# Patient Record
Sex: Male | Born: 1985 | Race: White | Hispanic: No | Marital: Single | Smoking: Never smoker
Health system: Southern US, Community
[De-identification: ages and names within clinical notes are randomized; demographics above are authoritative.]

## PROBLEM LIST (undated history)

## (undated) HISTORY — PX: HERNIA REPAIR: SHX51

---

## 2019-10-15 ENCOUNTER — Emergency Department (HOSPITAL_COMMUNITY): Payer: Self-pay

## 2019-10-15 ENCOUNTER — Emergency Department (HOSPITAL_COMMUNITY): Payer: Self-pay | Admitting: Anesthesiology

## 2019-10-15 ENCOUNTER — Encounter (HOSPITAL_COMMUNITY)
Admission: EM | Disposition: A | Discharge status: Home / Self Care | Payer: Self-pay | Source: Home / Self Care | Attending: Emergency Medicine

## 2019-10-15 ENCOUNTER — Ambulatory Visit (HOSPITAL_COMMUNITY)
Admission: EM | Admit: 2019-10-15 | Discharge: 2019-10-15 | Disposition: A | Discharge status: Home / Self Care | Payer: Self-pay | Attending: Emergency Medicine | Admitting: Emergency Medicine

## 2019-10-15 ENCOUNTER — Encounter (HOSPITAL_COMMUNITY): Payer: Self-pay | Admitting: Emergency Medicine

## 2019-10-15 DIAGNOSIS — X58XXXA Exposure to other specified factors, initial encounter: Secondary | ICD-10-CM | POA: Insufficient documentation

## 2019-10-15 DIAGNOSIS — T185XXA Foreign body in anus and rectum, initial encounter: Secondary | ICD-10-CM | POA: Insufficient documentation

## 2019-10-15 DIAGNOSIS — K644 Residual hemorrhoidal skin tags: Secondary | ICD-10-CM | POA: Insufficient documentation

## 2019-10-15 DIAGNOSIS — Z20822 Contact with and (suspected) exposure to covid-19: Secondary | ICD-10-CM | POA: Insufficient documentation

## 2019-10-15 DIAGNOSIS — F17290 Nicotine dependence, other tobacco product, uncomplicated: Secondary | ICD-10-CM | POA: Insufficient documentation

## 2019-10-15 HISTORY — PX: HEMORRHOID SURGERY: SHX153

## 2019-10-15 LAB — RESPIRATORY PANEL BY RT PCR (FLU A&B, COVID)
Influenza A by PCR: NEGATIVE
Influenza B by PCR: NEGATIVE
SARS Coronavirus 2 by RT PCR: NEGATIVE

## 2019-10-15 LAB — CBC WITH DIFFERENTIAL/PLATELET
Abs Immature Granulocytes: 0.03 10*3/uL (ref 0.00–0.07)
Basophils Absolute: 0 10*3/uL (ref 0.0–0.1)
Basophils Relative: 0 %
Eosinophils Absolute: 0.1 10*3/uL (ref 0.0–0.5)
Eosinophils Relative: 1 %
HCT: 48.2 % (ref 39.0–52.0)
Hemoglobin: 16.2 g/dL (ref 13.0–17.0)
Immature Granulocytes: 0 %
Lymphocytes Relative: 31 %
Lymphs Abs: 2.6 10*3/uL (ref 0.7–4.0)
MCH: 28.9 pg (ref 26.0–34.0)
MCHC: 33.6 g/dL (ref 30.0–36.0)
MCV: 86.1 fL (ref 80.0–100.0)
Monocytes Absolute: 0.7 10*3/uL (ref 0.1–1.0)
Monocytes Relative: 8 %
Neutro Abs: 4.8 10*3/uL (ref 1.7–7.7)
Neutrophils Relative %: 60 %
Platelets: 322 10*3/uL (ref 150–400)
RBC: 5.6 MIL/uL (ref 4.22–5.81)
RDW: 13.2 % (ref 11.5–15.5)
WBC: 8.2 10*3/uL (ref 4.0–10.5)
nRBC: 0 % (ref 0.0–0.2)

## 2019-10-15 LAB — BASIC METABOLIC PANEL
Anion gap: 10 (ref 5–15)
BUN: 11 mg/dL (ref 6–20)
CO2: 26 mmol/L (ref 22–32)
Calcium: 9.9 mg/dL (ref 8.9–10.3)
Chloride: 102 mmol/L (ref 98–111)
Creatinine, Ser: 1.04 mg/dL (ref 0.61–1.24)
GFR calc Af Amer: >60 mL/min (ref >60)
GFR calc non Af Amer: >60 mL/min (ref >60)
Glucose, Bld: 100 mg/dL — ABNORMAL HIGH (ref 70–99)
Potassium: 4.5 mmol/L (ref 3.5–5.1)
Sodium: 138 mmol/L (ref 135–145)

## 2019-10-15 LAB — SAMPLE TO BLOOD BANK

## 2019-10-15 SURGERY — HEMORRHOIDECTOMY
Anesthesia: General | Site: Rectum

## 2019-10-15 MED ORDER — LACTATED RINGERS IV SOLN: INTRAVENOUS | Status: DC | PRN

## 2019-10-15 MED ORDER — FENTANYL CITRATE (PF) 250 MCG/5ML IJ SOLN
INTRAMUSCULAR | Status: DC | PRN
  Administered 2019-10-15: 100 ug via INTRAVENOUS

## 2019-10-15 MED ORDER — FENTANYL CITRATE (PF) 100 MCG/2ML IJ SOLN
INTRAMUSCULAR | Status: AC
  Filled 2019-10-15: qty 2

## 2019-10-15 MED ORDER — FENTANYL CITRATE (PF) 100 MCG/2ML IJ SOLN
25.0000 ug | INTRAMUSCULAR | Status: DC | PRN
  Administered 2019-10-15: 23:00:00 50 ug via INTRAVENOUS

## 2019-10-15 MED ORDER — PROMETHAZINE HCL 25 MG/ML IJ SOLN: 6.2500 mg | INTRAMUSCULAR | Status: DC | PRN

## 2019-10-15 MED ORDER — FENTANYL CITRATE (PF) 250 MCG/5ML IJ SOLN
INTRAMUSCULAR | Status: AC
  Filled 2019-10-15: qty 5

## 2019-10-15 MED ORDER — 0.9 % SODIUM CHLORIDE (POUR BTL) OPTIME
TOPICAL | Status: DC | PRN
  Administered 2019-10-15: 22:00:00 1000 mL

## 2019-10-15 MED ORDER — DEXAMETHASONE SODIUM PHOSPHATE 10 MG/ML IJ SOLN
INTRAMUSCULAR | Status: DC | PRN
  Administered 2019-10-15: 10 mg via INTRAVENOUS

## 2019-10-15 MED ORDER — MIDAZOLAM HCL 2 MG/2ML IJ SOLN
INTRAMUSCULAR | Status: DC | PRN
  Administered 2019-10-15: 2 mg via INTRAVENOUS

## 2019-10-15 MED ORDER — MIDAZOLAM HCL 2 MG/2ML IJ SOLN
INTRAMUSCULAR | Status: AC
  Filled 2019-10-15: qty 2

## 2019-10-15 MED ORDER — MORPHINE SULFATE (PF) 4 MG/ML IV SOLN
4.0000 mg | Freq: Once | INTRAVENOUS | Status: AC
  Administered 2019-10-15: 21:00:00 4 mg via INTRAVENOUS
  Filled 2019-10-15: qty 1

## 2019-10-15 MED ORDER — SODIUM CHLORIDE 0.9 % IV BOLUS
1000.0000 mL | Freq: Once | INTRAVENOUS | Status: AC
  Administered 2019-10-15: 21:00:00 1000 mL via INTRAVENOUS

## 2019-10-15 MED ORDER — ONDANSETRON HCL 4 MG/2ML IJ SOLN
INTRAMUSCULAR | Status: DC | PRN
  Administered 2019-10-15: 4 mg via INTRAVENOUS

## 2019-10-15 MED ORDER — LIDOCAINE HCL (CARDIAC) PF 100 MG/5ML IV SOSY
PREFILLED_SYRINGE | INTRAVENOUS | Status: DC | PRN
  Administered 2019-10-15: 100 mg via INTRATRACHEAL

## 2019-10-15 MED ORDER — PROPOFOL 10 MG/ML IV BOLUS
INTRAVENOUS | Status: DC | PRN
  Administered 2019-10-15: 200 mg via INTRAVENOUS

## 2019-10-15 SURGICAL SUPPLY — 34 items
CANISTER SUCT 3000ML PPV (MISCELLANEOUS) ×3 IMPLANT
COVER SURGICAL LIGHT HANDLE (MISCELLANEOUS) ×3 IMPLANT
COVER WAND RF STERILE (DRAPES) ×3 IMPLANT
DECANTER SPIKE VIAL GLASS SM (MISCELLANEOUS) ×3 IMPLANT
DRAPE HALF SHEET 40X57 (DRAPES) IMPLANT
DRAPE LAPAROTOMY T 102X78X121 (DRAPES) IMPLANT
DRAPE UTILITY XL STRL (DRAPES) ×6 IMPLANT
DRSG PAD ABDOMINAL 8X10 ST (GAUZE/BANDAGES/DRESSINGS) ×3 IMPLANT
ELECT REM PT RETURN 9FT ADLT (ELECTROSURGICAL) ×3
ELECTRODE REM PT RTRN 9FT ADLT (ELECTROSURGICAL) ×1 IMPLANT
GAUZE SPONGE 4X4 12PLY STRL (GAUZE/BANDAGES/DRESSINGS) ×3 IMPLANT
GLOVE BIOGEL M STRL SZ7.5 (GLOVE) ×3 IMPLANT
GLOVE BIOGEL PI IND STRL 8 (GLOVE) ×2 IMPLANT
GLOVE BIOGEL PI INDICATOR 8 (GLOVE) ×4
GOWN STRL REUS W/ TWL LRG LVL3 (GOWN DISPOSABLE) ×1 IMPLANT
GOWN STRL REUS W/TWL 2XL LVL3 (GOWN DISPOSABLE) ×3 IMPLANT
GOWN STRL REUS W/TWL LRG LVL3 (GOWN DISPOSABLE) ×2
KIT BASIN OR (CUSTOM PROCEDURE TRAY) ×3 IMPLANT
KIT SIGMOIDOSCOPE (SET/KITS/TRAYS/PACK) IMPLANT
KIT TURNOVER KIT B (KITS) ×3 IMPLANT
NEEDLE HYPO 25GX1X1/2 BEV (NEEDLE) ×3 IMPLANT
NS IRRIG 1000ML POUR BTL (IV SOLUTION) ×3 IMPLANT
PACK GENERAL/GYN (CUSTOM PROCEDURE TRAY) IMPLANT
PACK LITHOTOMY IV (CUSTOM PROCEDURE TRAY) IMPLANT
PAD ARMBOARD 7.5X6 YLW CONV (MISCELLANEOUS) ×6 IMPLANT
PENCIL SMOKE EVACUATOR (MISCELLANEOUS) ×3 IMPLANT
SHEARS HARMONIC 9CM CVD (BLADE) IMPLANT
SPONGE SURGIFOAM ABS GEL 100 (HEMOSTASIS) IMPLANT
SURGILUBE 2OZ TUBE FLIPTOP (MISCELLANEOUS) ×3 IMPLANT
SUT CHROMIC 3 0 SH 27 (SUTURE) IMPLANT
SYR CONTROL 10ML LL (SYRINGE) ×3 IMPLANT
TOWEL GREEN STERILE (TOWEL DISPOSABLE) ×3 IMPLANT
TOWEL GREEN STERILE FF (TOWEL DISPOSABLE) ×3 IMPLANT
UNDERPAD 30X30 (UNDERPADS AND DIAPERS) ×3 IMPLANT

## 2019-10-15 NOTE — Anesthesia Postprocedure Evaluation (Signed)
Anesthesia Post Note  Patient: Xavier Mitchell  Procedure(s) Performed: Exam under anesthesia, Removal of Rectal Foreign Body (N/A Rectum)     Patient location during evaluation: PACU Anesthesia Type: General Level of consciousness: sedated Pain management: pain level controlled Vital Signs Assessment: post-procedure vital signs reviewed and stable Respiratory status: spontaneous breathing and respiratory function stable Cardiovascular status: stable Postop Assessment: no apparent nausea or vomiting Anesthetic complications: no    Last Vitals:  Vitals:   10/15/19 2330 10/15/19 2343  BP: (!) 131/99 (!) 141/98  Pulse: 85 90  Resp: 15 (!) 22  Temp:  36.7 C  SpO2: 100% 100%    Last Pain:  Vitals:   10/15/19 2330  TempSrc:   PainSc: 3                  Shayann Garbutt DANIEL

## 2019-10-15 NOTE — Anesthesia Procedure Notes (Signed)
Procedure Name: LMA Insertion Date/Time: 10/15/2019 10:39 PM Performed by: Molli Hazard, CRNA Pre-anesthesia Checklist: Patient identified, Emergency Drugs available, Suction available and Patient being monitored Patient Re-evaluated:Patient Re-evaluated prior to induction Oxygen Delivery Method: Circle system utilized Preoxygenation: Pre-oxygenation with 100% oxygen Induction Type: IV induction Ventilation: Mask ventilation without difficulty LMA: LMA inserted LMA Size: 4.0 Number of attempts: 1 Placement Confirmation: positive ETCO2 Tube secured with: Tape Dental Injury: Teeth and Oropharynx as per pre-operative assessment

## 2019-10-15 NOTE — Op Note (Signed)
10/15/2019  10:59 PM  PATIENT:  Xavier Mitchell  34 y.o. male  PRE-OPERATIVE DIAGNOSIS:  Rectal Foreign body  POST-OPERATIVE DIAGNOSIS:  Rectal Foreign body (dildo)  PROCEDURE:  Procedure(s): Exam under anesthesia, anoscopy,  Removal of Rectal Foreign Body  SURGEON:  Surgeon(s): Gaynelle Adu, MD  ASSISTANTS: none   ANESTHESIA:   general  DRAINS: none   LOCAL MEDICATIONS USED:  NONE  SPECIMEN:  Source of Specimen:  rectal foreign body/dildo  DISPOSITION OF SPECIMEN:  given back to patient  COUNTS:  YES  INDICATION FOR PROCEDURE: Patient is a 34 year old male who had a sex toy become lodged in his rectum earlier this morning.  He was not able to extract it.  He presented to the emergency room for evaluation.  It was not radiopaque so ED staff ordered a CT abdomen pelvis which demonstrated a rectal foreign body measuring up to 19 cm long by 4 cm wide extending up to the level of the umbilicus.  It was able to be felt on digital rectal exam but could not be grasped and extracted in the emergency room.  Recommended exam under anesthesia with removal.  Please see separately dictated note regarding discussion about risk and benefits  PROCEDURE: Patient's rapid Covid test was negative.  He was brought to the OR 2 at [facility] placed supine on the operating room table.  General LMA anesthesia was established.  He was placed in lithotomy position with the appropriate padding.  He has perineum and buttocks were prepped and draped with Betadine in the usual standard surgical fashion.  Surgical timeout was performed.  Visual inspection of his perineum and anus revealed some redundant nonthrombosed external hemorrhoidal tissue.  Digital rectal exam was performed and I could palpate the foreign body.  It was probably about 9cm from the anal verge. I then serially dilated the anus with my fingers.  Then a Hill-Ferguson retractor was placed and I was able to grasp the edge of the foreign body with  a hemorrhoidal clamp and remove it from the rectum.  It was removed in its entirety.  Anoscopy revealed no trauma to the anal rectal area other than a very small superficial tear in some of the external hemorrhoidal tissue which was not an unexpected occurrence. it had a little bit of minor oozing.  Patient was placed back in supine position extubated and taken to the recovery room in stable condition.  All counts were correct x2.  No immediate complications.  PLAN OF CARE: Discharge to home after PACU  PATIENT DISPOSITION:  PACU - hemodynamically stable.   Delay start of Pharmacological VTE agent (>24hrs) due to surgical blood loss or risk of bleeding:  not applicable  Mary Sella. Andrey Campanile, MD, FACS General, Bariatric, & Minimally Invasive Surgery [facility], [place]

## 2019-10-15 NOTE — Discharge Instructions (Signed)
[facility] _______[facility]  RECTAL SURGERY POST OP INSTRUCTIONS: POST OP INSTRUCTIONS  Always review your discharge instruction sheet given to you by the facility where your surgery was performed. IF YOU HAVE DISABILITY OR FAMILY LEAVE FORMS, YOU MUST BRING THEM TO THE OFFICE FOR PROCESSING.   DO NOT GIVE THEM TO YOUR DOCTOR.  1. A  prescription for pain medication may be given to you upon discharge.  Take your pain medication as prescribed, if needed.  If narcotic pain medicine is not needed, then you may take acetaminophen (Tylenol) or ibuprofen (Advil) as needed. 2. Take your usually prescribed medications unless otherwise directed. 3. If you need a refill on your pain medication, please contact your pharmacy.  They will contact our office to request authorization. Prescriptions will not be filled after 5 pm or on week-ends. 4. You should follow a light diet the first 48 hours after arrival home, such as soup and crackers, etc.  Be sure to include lots of fluids daily.  Resume your normal diet 2-3 days after surgery.. 5. Most patients will experience some swelling and discomfort in the rectal area. Ice packs, reclining and warm tub soaks will help.  Swelling and discomfort can take several days to resolve.  6. It is common to experience some constipation if taking pain medication after surgery.  Increasing fluid intake and taking a stool softener (such as Colace) will usually help or prevent this problem from occurring.  A mild laxative (Milk of Magnesia or Miralax) should be taken according to package directions if there are no bowel movements after 48 hours. 7. Unless discharge instructions indicate otherwise, leave your bandage dry and in place for 24 hours, or remove the bandage if you have a bowel movement. You may notice a small amount of bleeding with bowel movements for the first few days. You may have some packing in the rectum which will come out over the first day or two. You  will need to wear an absorbent pad or soft cotton gauze in your underwear until the drainage stops.it. 8. ACTIVITIES:  You may resume regular (light) daily activities beginning the next day--such as daily self-care, walking, climbing stairs--gradually increasing activities as tolerated.  You may have sexual intercourse when it is comfortable.  Refrain from any heavy lifting or straining until approved by your doctor. a. You may drive when you are no longer taking prescription pain medication, you can comfortably wear a seatbelt, and you can safely maneuver your car and apply brakes. b. RETURN TO WORK: : ____________________ c.  9. You should see your doctor in the office for a follow-up appointment approximately 2-3 weeks after your surgery.  Make sure that you call for this appointment within a day or two after you arrive home to insure a convenient appointment time. 10. OTHER INSTRUCTIONS:  __________________________________________________________________________________________________________________________________________________________________________________________  WHEN TO CALL YOUR DOCTOR: 1. Fever over 101.0 2. Inability to urinate 3. Nausea and/or vomiting 4. Extreme swelling or bruising 5. Continued bleeding from rectum. 6. Increased pain, redness, or drainage from the incision 7. Constipation  The clinic staff is available to answer your questions during regular business hours.  Please don't hesitate to call and ask to speak to one of the nurses for clinical concerns.  If you have a medical emergency, go to the nearest emergency room or call 911.  A surgeon from [facility] is always on call at the hospital   [address], [place], [place], [place]  [address] ?  [id number], [place], [place]   [address] [phone] ? [phone] ? FAX [phone] Web site: [email or web address]  ........Marland Kitchen   Managing Your Pain After Surgery Without  Opioids    Thank you for participating in our program to help patients manage their pain after surgery without opioids. This is part of our effort to provide you with the best care possible, without exposing you or your family to the risk that opioids pose.  What pain can I expect after surgery? You can expect to have some pain after surgery. This is normal. The pain is typically worse the day after surgery, and quickly begins to get better. Many studies have found that many patients are able to manage their pain after surgery with Over-the-Counter (OTC) medications such as Tylenol and Motrin. If you have a condition that does not allow you to take Tylenol or Motrin, notify your surgical team.  How will I manage my pain? The best strategy for controlling your pain after surgery is around the clock pain control with Tylenol (acetaminophen) and Motrin (ibuprofen or Advil). Alternating these medications with each other allows you to maximize your pain control. In addition to Tylenol and Motrin, you can use heating pads or ice packs on your incisions to help reduce your pain.  How will I alternate your regular strength over-the-counter pain medication? You will take a dose of pain medication every three hours. ; Start by taking 650 mg of Tylenol (2 pills of 325 mg) ; 3 hours later take 600 mg of Motrin (3 pills of 200 mg) ; 3 hours after taking the Motrin take 650 mg of Tylenol ; 3 hours after that take 600 mg of Motrin.   - 1 -  See example - if your first dose of Tylenol is at 12:00 PM   12:00 PM Tylenol 650 mg (2 pills of 325 mg)  3:00 PM Motrin 600 mg (3 pills of 200 mg)  6:00 PM Tylenol 650 mg (2 pills of 325 mg)  9:00 PM Motrin 600 mg (3 pills of 200 mg)  Continue alternating every 3 hours   We recommend that you follow this schedule around-the-clock for at least 3 days after surgery, or until you feel that it is no longer needed. Use the table on the last page of this handout to  keep track of the medications you are taking. Important: Do not take more than 3000mg  of Tylenol or 1800mg  of Motrin in a 24-hour period. Do not take ibuprofen/Motrin if you have a history of bleeding stomach ulcers, severe kidney disease, &/or actively taking a blood thinner  What if I still have pain? If you have pain that is not controlled with the over-the-counter pain medications (Tylenol and Motrin or Advil) you might have what we call "breakthrough" pain. You will receive a prescription for a small amount of an opioid pain medication such as Oxycodone, Tramadol, or Tylenol with Codeine. Use these opioid pills in the first 24 hours after surgery if you have breakthrough pain. Do not take more than 1 pill every 4-6 hours.  If you still have uncontrolled pain after using all opioid pills, don't hesitate to call our staff using the number provided. We will help make sure you are managing your pain in the best way possible, and if necessary, we can provide a prescription for additional pain medication.   Day 1    Time  Name of Medication Number of pills taken  Amount of Acetaminophen  Pain Level  Comments  AM PM       AM PM       AM PM       AM PM       AM PM       AM PM       AM PM       AM PM       Total Daily amount of Acetaminophen Do not take more than  3,000 mg per day      Day 2    Time  Name of Medication Number of pills taken  Amount of Acetaminophen  Pain Level   Comments  AM PM       AM PM       AM PM       AM PM       AM PM       AM PM       AM PM       AM PM       Total Daily amount of Acetaminophen Do not take more than  3,000 mg per day      Day 3    Time  Name of Medication Number of pills taken  Amount of Acetaminophen  Pain Level   Comments  AM PM       AM PM       AM PM       AM PM          AM PM       AM PM       AM PM       AM PM       Total Daily amount of Acetaminophen Do not take more than  3,000 mg per day      Day  4    Time  Name of Medication Number of pills taken  Amount of Acetaminophen  Pain Level   Comments  AM PM       AM PM       AM PM       AM PM       AM PM       AM PM       AM PM       AM PM       Total Daily amount of Acetaminophen Do not take more than  3,000 mg per day      Day 5    Time  Name of Medication Number of pills taken  Amount of Acetaminophen  Pain Level   Comments  AM PM       AM PM       AM PM       AM PM       AM PM       AM PM       AM PM       AM PM       Total Daily amount of Acetaminophen Do not take more than  3,000 mg per day       Day 6    Time  Name of Medication Number of pills taken  Amount of Acetaminophen  Pain Level  Comments  AM PM       AM PM       AM PM       AM PM       AM PM       AM PM       AM  PM       AM PM       Total Daily amount of Acetaminophen Do not take more than  3,000 mg per day      Day 7    Time  Name of Medication Number of pills taken  Amount of Acetaminophen  Pain Level   Comments  AM PM       AM PM       AM PM       AM PM       AM PM       AM PM       AM PM       AM PM       Total Daily amount of Acetaminophen Do not take more than  3,000 mg per day        For additional information about how and where to safely dispose of unused opioid medications - [employer]  Disclaimer: This document contains information and/or instructional materials adapted from [place] for the typical patient with your condition. It does not replace medical advice from your health care provider because your experience may differ from that of the typical patient. Talk to your health care provider if you have any questions about this document, your condition or your treatment plan. Adapted from [place]

## 2019-10-15 NOTE — ED Provider Notes (Signed)
[facility] EMERGENCY DEPARTMENT Provider Note   CSN: [id number] Arrival date & time: 10/15/19  1357     History Chief Complaint  Patient presents with  . Foreign Body in Rectum    Xavier Mitchell is a 34 y.o. male with no significant past medical history who presents today for evaluation of a foreign body.  He reports that approximately 2 hours prior to arrival he inserted a approximately 6 inch long rubber sex toys into his rectum accidentally too far.  He reports 5 out of 10 pain in his lower abdomen.  He states that prior to coming he tried multiple times to remove it on his own by methods including digital extraction and attempts at defecating it out.  He states that it was angled backwards towards his tailbone and he was unable to extract it.   HPI     History reviewed. No pertinent past medical history.  There are no problems to display for this patient.   Past Surgical History:  Procedure Laterality Date  . HERNIA REPAIR     As an infant       History reviewed. No pertinent family history.  Social History   Tobacco Use  . Smoking status: Never Smoker  . Smokeless tobacco: Never Used  Substance Use Topics  . Alcohol use: Not on file  . Drug use: Not on file    Home Medications Prior to Admission medications   Not on File    Allergies    Patient has no known allergies.  Review of Systems   Review of Systems  Constitutional: Negative for chills and fever.  HENT: Negative for congestion.   Respiratory: Negative for cough and shortness of breath.   Gastrointestinal: Positive for abdominal pain and rectal pain. Negative for nausea and vomiting.  Musculoskeletal: Negative for back pain and neck pain.  Neurological: Negative for weakness and headaches.  Psychiatric/Behavioral: Negative for confusion.    Physical Exam Updated Vital Signs BP (!) 138/91   Pulse 81   Temp 97.9 F (36.6 C) (Oral)   Resp 18   SpO2 99%   Physical  Exam Vitals and nursing note reviewed. Exam conducted with a chaperone present Jeraldine Loots).  Constitutional:      General: He is not in acute distress.    Appearance: He is well-developed. He is not diaphoretic.  HENT:     Head: Normocephalic and atraumatic.  Eyes:     General: No scleral icterus.       Right eye: No discharge.        Left eye: No discharge.     Conjunctiva/sclera: Conjunctivae normal.  Cardiovascular:     Rate and Rhythm: Normal rate and regular rhythm.  Pulmonary:     Effort: Pulmonary effort is normal. No respiratory distress.     Breath sounds: No stridor.  Abdominal:     General: There is no distension.  Genitourinary:    Comments: DRE with palpable foreign body in the rectum with out a flange or base palpable.   Musculoskeletal:        General: No deformity.     Cervical back: Normal range of motion.  Skin:    General: Skin is warm and dry.  Neurological:     General: No focal deficit present.     Mental Status: He is alert.     Cranial Nerves: No cranial nerve deficit.     Motor: No abnormal muscle tone.  Psychiatric:  Mood and Affect: Mood normal.        Behavior: Behavior normal.     ED Results / Procedures / Treatments   Labs (all labs ordered are listed, but only abnormal results are displayed) Labs Reviewed  BASIC METABOLIC PANEL - Abnormal; Notable for the following components:      Result Value   Glucose, Bld 100 (*)    All other components within normal limits  RESPIRATORY PANEL BY RT PCR (FLU A&B, COVID)  CBC WITH DIFFERENTIAL/PLATELET  SAMPLE TO BLOOD BANK    EKG None  Radiology CT Abdomen Pelvis Wo Contrast  Result Date: 10/15/2019 CLINICAL DATA:  Foreign body in rectum EXAM: CT ABDOMEN AND PELVIS WITHOUT CONTRAST TECHNIQUE: Multidetector CT imaging of the abdomen and pelvis was performed following the standard protocol without IV contrast. COMPARISON:  10/15/2019 radiograph FINDINGS: Lower chest: Lung bases are clear.   Heart size is normal Hepatobiliary: No focal liver abnormality is seen. No gallstones, gallbladder wall thickening, or biliary dilatation. Pancreas: Unremarkable. No pancreatic ductal dilatation or surrounding inflammatory changes. Spleen: Normal in size without focal abnormality. Adrenals/Urinary Tract: Adrenal glands are normal. No hydronephrosis. Punctate stone lower pole left kidney. The bladder is unremarkable Stomach/Bowel: The stomach is normal. No dilated small bowel. Negative appendix. Large foreign body within the rectum and sigmoid colon, dome-shaped at the apex and measures about 19.3 cm maximum length and about 4 cm in diameter. No extraluminal gas to suggest a perforation. Vascular/Lymphatic: No significant vascular findings are present. No enlarged abdominal or pelvic lymph nodes. Reproductive: Prostate is unremarkable. Other: Negative for free air or free fluid Musculoskeletal: No acute or significant osseous findings. IMPRESSION: 1. 19.3 cm long by 4 cm wide foreign body within the rectum and sigmoid colon. No extraluminal gas to suggest perforation. 2. Nonobstructing stone in the left kidney Electronically Signed   By: Donavan Foil M.D.   On: 10/15/2019 20:45   DG Abd 2 Views  Result Date: 10/15/2019 CLINICAL DATA:  History of rectal foreign body. EXAM: ABDOMEN - 2 VIEW COMPARISON:  No prior. FINDINGS: No bowel distention. Stool noted throughout the colon. No radiopaque foreign body noted. Pelvic calcifications consistent with phleboliths. No acute bony abnormality identified. Lung bases are clear. IMPRESSION: No radiopaque foreign body.  No bowel distention or free air. Electronically Signed   By: Marcello Moores  Register   On: 10/15/2019 14:51    Procedures Procedures (including critical care time)  Medications Ordered in ED Medications  sodium chloride 0.9 % bolus 1,000 mL (1,000 mLs Intravenous New Bag/Given 10/15/19 2101)  morphine 4 MG/ML injection 4 mg (4 mg Intravenous Given 10/15/19 2101)     ED Course  I have reviewed the triage vital signs and the nursing notes.  Pertinent labs & imaging results that were available during my care of the patient were reviewed by me and considered in my medical decision making (see chart for details).    MDM Rules/Calculators/A&P                     Xavier Mitchell presents today for evaluation for a rectal foreign body.  He reports that about 2 hours PTA he was using a sex toy and he accidentally insterted it too far into his rectum.  On exam palpable foreign object in rectum.  X-rays obtained with out visualized foreign body.  CT scan was obtained showing a 19.3cm long by 4cm wide foreign body in the rectum and sigmoid colon.  I spoke with Dr.  Wilson from surgery who will see patient for removal in the OR.  Patients pain was treated with morphine, rapid covid test was sent.  Patient kept NPO.    Patient was seen as a shared visit with Dr. Jeraldine Loots.    Provided patient education about the importance of a wide base.   Patient remained hemodynamically stable while in my care.    Final Clinical Impression(s) / ED Diagnoses Final diagnoses:  Foreign body anus/rectum    Rx / DC Orders ED Discharge Orders    None       Norman Clay 10/15/19 2221    Gerhard Munch, MD 10/15/19 2320

## 2019-10-15 NOTE — ED Triage Notes (Signed)
Pt reports 3 hours ago getting a 6 inch sex toy stuck in rectum.

## 2019-10-15 NOTE — Anesthesia Preprocedure Evaluation (Addendum)
Anesthesia Evaluation  Patient identified by MRN, date of birth, ID band Patient awake    Reviewed: Allergy & Precautions, NPO status , Patient's Chart, lab work & pertinent test results  History of Anesthesia Complications Negative for: history of anesthetic complications  Airway Mallampati: II  TM Distance: >3 FB Neck ROM: Full    Dental no notable dental hx. (+) Dental Advisory Given   Pulmonary neg pulmonary ROS,    Pulmonary exam normal        Cardiovascular negative cardio ROS Normal cardiovascular exam     Neuro/Psych negative neurological ROS     GI/Hepatic Neg liver ROS,   Endo/Other  negative endocrine ROS  Renal/GU negative Renal ROS     Musculoskeletal negative musculoskeletal ROS (+)   Abdominal   Peds  Hematology negative hematology ROS (+)   Anesthesia Other Findings Day of surgery medications reviewed with the patient.  Reproductive/Obstetrics                            Anesthesia Physical Anesthesia Plan  ASA: II and emergent  Anesthesia Plan: General   Post-op Pain Management:    Induction: Intravenous  PONV Risk Score and Plan: 2 and Ondansetron and Dexamethasone  Airway Management Planned: LMA  Additional Equipment:   Intra-op Plan:   Post-operative Plan: Extubation in OR  Informed Consent: I have reviewed the patients History and Physical, chart, labs and discussed the procedure including the risks, benefits and alternatives for the proposed anesthesia with the patient or authorized representative who has indicated his/her understanding and acceptance.     Dental advisory given  Plan Discussed with: Anesthesiologist and CRNA  Anesthesia Plan Comments:        Anesthesia Quick Evaluation

## 2019-10-15 NOTE — Transfer of Care (Signed)
Immediate Anesthesia Transfer of Care Note  Patient: Xavier Mitchell  Procedure(s) Performed: Exam under anesthesia, Removal of Rectal Foreign Body (N/A Rectum)  Patient Location: PACU  Anesthesia Type:General  Level of Consciousness: awake, alert  and oriented  Airway & Oxygen Therapy: Patient Spontanous Breathing  Post-op Assessment: Report given to RN and Post -op Vital signs reviewed and stable  Post vital signs: Reviewed and stable  Last Vitals:  Vitals Value Taken Time  BP    Temp    Pulse 101 10/15/19 2313  Resp    SpO2 100 % 10/15/19 2313  Vitals shown include unvalidated device data.  Last Pain:  Vitals:   10/15/19 2103  TempSrc:   PainSc: 6          Complications: No apparent anesthesia complications

## 2019-10-15 NOTE — H&P (Signed)
Xavier Mitchell is an 34 y.o. male.   Chief Complaint: rectal pain, stuck sex toy HPI: 34 year old otherwise healthy male reports that he got a sex toy stuck in his rectum around 7 AM this morning.  He could not manually remove it himself.  He tried having a bowel movement but did not result in passing the sex toy.  He ended up coming to the emergency room around 2 PM since it had not passed.  He denies any fever, chills, nausea or vomiting.  He reports some generalized rectal and abdominal discomfort.  He denies any prior abdominal surgery.  He denies any chest pain, chest pressure, shortness of breath, fever, loss of taste or smell    History reviewed. No pertinent past medical history.  Denies PMHs Denies PSHx No family history on file. Social History:  Electronic cigarettes, infrequent etoh; occasional THC - last use a few days ago. .  Allergies: No Known Allergies  (Not in a hospital admission)   Results for orders placed or performed during the hospital encounter of 10/15/19 (from the past 48 hour(s))  CBC with Differential     Status: None   Collection Time: 10/15/19  6:59 PM  Result Value Ref Range   WBC 8.2 4.0 - 10.5 K/uL   RBC 5.60 4.22 - 5.81 MIL/uL   Hemoglobin 16.2 13.0 - 17.0 g/dL   HCT 12.4 58.0 - 99.8 %   MCV 86.1 80.0 - 100.0 fL   MCH 28.9 26.0 - 34.0 pg   MCHC 33.6 30.0 - 36.0 g/dL   RDW 33.8 25.0 - 53.9 %   Platelets 322 150 - 400 K/uL   nRBC 0.0 0.0 - 0.2 %   Neutrophils Relative % 60 %   Neutro Abs 4.8 1.7 - 7.7 K/uL   Lymphocytes Relative 31 %   Lymphs Abs 2.6 0.7 - 4.0 K/uL   Monocytes Relative 8 %   Monocytes Absolute 0.7 0.1 - 1.0 K/uL   Eosinophils Relative 1 %   Eosinophils Absolute 0.1 0.0 - 0.5 K/uL   Basophils Relative 0 %   Basophils Absolute 0.0 0.0 - 0.1 K/uL   Immature Granulocytes 0 %   Abs Immature Granulocytes 0.03 0.00 - 0.07 K/uL    Comment: Performed at [facility], [address], [place], [place] [address]  Basic metabolic  panel     Status: Abnormal   Collection Time: 10/15/19  6:59 PM  Result Value Ref Range   Sodium 138 135 - 145 mmol/L   Potassium 4.5 3.5 - 5.1 mmol/L   Chloride 102 98 - 111 mmol/L   CO2 26 22 - 32 mmol/L   Glucose, Bld 100 (H) 70 - 99 mg/dL   BUN 11 6 - 20 mg/dL   Creatinine, Ser 1.93 0.61 - 1.24 mg/dL   Calcium 9.9 8.9 - 79.0 mg/dL   GFR calc non Af Amer >60 >60 mL/min   GFR calc Af Amer >60 >60 mL/min   Anion gap 10 5 - 15    Comment: Performed at [facility], [address], [place], [place] [address]  Respiratory Panel by RT PCR (Flu A&B, Covid) - Nasopharyngeal Swab     Status: None   Collection Time: 10/15/19  8:45 PM   Specimen: Nasopharyngeal Swab  Result Value Ref Range   SARS Coronavirus 2 by RT PCR NEGATIVE NEGATIVE    Comment: (NOTE) SARS-CoV-2 target nucleic acids are NOT DETECTED. The SARS-CoV-2 RNA is generally detectable in upper  respiratoy specimens during the acute phase of infection. The lowest concentration of SARS-CoV-2 viral copies this assay can detect is 131 copies/mL. A negative result does not preclude SARS-Cov-2 infection and should not be used as the sole basis for treatment or other patient management decisions. A negative result may occur with  improper specimen collection/handling, submission of specimen other than nasopharyngeal swab, presence of viral mutation(s) within the areas targeted by this assay, and inadequate number of viral copies (<131 copies/mL). A negative result must be combined with clinical observations, patient history, and epidemiological information. The expected result is Negative. Fact Sheet for Patients:  https://www.moore.com/ Fact Sheet for Healthcare Providers:  https://www.young.biz/ This test is not yet ap proved or cleared by the [place] FDA and  has been authorized for detection and/or diagnosis of SARS-CoV-2 by FDA under an Emergency Use Authorization (EUA). This  EUA will remain  in effect (meaning this test can be used) for the duration of the COVID-19 declaration under Section 564(b)(1) of the Act, 21 U.S.C. section 360bbb-3(b)(1), unless the authorization is terminated or revoked sooner.    Influenza A by PCR NEGATIVE NEGATIVE   Influenza B by PCR NEGATIVE NEGATIVE    Comment: (NOTE) The Xpert Xpress SARS-CoV-2/FLU/RSV assay is intended as an aid in  the diagnosis of influenza from Nasopharyngeal swab specimens and  should not be used as a sole basis for treatment. Nasal washings and  aspirates are unacceptable for Xpert Xpress SARS-CoV-2/FLU/RSV  testing. Fact Sheet for Patients: https://www.moore.com/ Fact Sheet for Healthcare Providers: https://www.young.biz/ This test is not yet approved or cleared by the [place] FDA and  has been authorized for detection and/or diagnosis of SARS-CoV-2 by  FDA under an Emergency Use Authorization (EUA). This EUA will remain  in effect (meaning this test can be used) for the duration of the  Covid-19 declaration under Section 564(b)(1) of the Act, 21  U.S.C. section 360bbb-3(b)(1), unless the authorization is  terminated or revoked. Performed at [facility], [address] [place], [place] [address]   Sample to Blood Bank     Status: None   Collection Time: 10/15/19  8:55 PM  Result Value Ref Range   Blood Bank Specimen SAMPLE AVAILABLE FOR TESTING    Sample Expiration      10/16/2019,2359 Performed at [facility], [address], [place], [place] [address]    CT Abdomen Pelvis Wo Contrast  Result Date: 10/15/2019 CLINICAL DATA:  Foreign body in rectum EXAM: CT ABDOMEN AND PELVIS WITHOUT CONTRAST TECHNIQUE: Multidetector CT imaging of the abdomen and pelvis was performed following the standard protocol without IV contrast. COMPARISON:  10/15/2019 radiograph FINDINGS: Lower chest: Lung bases are clear.  Heart size is normal Hepatobiliary: No  focal liver abnormality is seen. No gallstones, gallbladder wall thickening, or biliary dilatation. Pancreas: Unremarkable. No pancreatic ductal dilatation or surrounding inflammatory changes. Spleen: Normal in size without focal abnormality. Adrenals/Urinary Tract: Adrenal glands are normal. No hydronephrosis. Punctate stone lower pole left kidney. The bladder is unremarkable Stomach/Bowel: The stomach is normal. No dilated small bowel. Negative appendix. Large foreign body within the rectum and sigmoid colon, dome-shaped at the apex and measures about 19.3 cm maximum length and about 4 cm in diameter. No extraluminal gas to suggest a perforation. Vascular/Lymphatic: No significant vascular findings are present. No enlarged abdominal or pelvic lymph nodes. Reproductive: Prostate is unremarkable. Other: Negative for free air or free fluid Musculoskeletal: No acute or significant osseous findings. IMPRESSION: 1. 19.3 cm  long by 4 cm wide foreign body within the rectum and sigmoid colon. No extraluminal gas to suggest perforation. 2. Nonobstructing stone in the left kidney Electronically Signed   By: Donavan Foil M.D.   On: 10/15/2019 20:45   DG Abd 2 Views  Result Date: 10/15/2019 CLINICAL DATA:  History of rectal foreign body. EXAM: ABDOMEN - 2 VIEW COMPARISON:  No prior. FINDINGS: No bowel distention. Stool noted throughout the colon. No radiopaque foreign body noted. Pelvic calcifications consistent with phleboliths. No acute bony abnormality identified. Lung bases are clear. IMPRESSION: No radiopaque foreign body.  No bowel distention or free air. Electronically Signed   By: Marcello Moores  Register   On: 10/15/2019 14:51    Review of Systems  Constitutional: Negative for activity change, appetite change, chills, fever and unexpected weight change.  HENT: Negative for congestion, hearing loss and trouble swallowing.   Eyes: Negative for discharge and visual disturbance.  Respiratory: Negative for chest  tightness, shortness of breath and wheezing.   Cardiovascular: Negative for chest pain and leg swelling.       No PND, no orthopnea, no DOE  Gastrointestinal: Positive for abdominal pain and rectal pain. Negative for anal bleeding, blood in stool, constipation and vomiting.       See HPI  Genitourinary: Negative for difficulty urinating, dysuria and hematuria.  Musculoskeletal: Negative.   Skin: Negative for rash.  Neurological: Negative for seizures and speech difficulty.  Hematological: Negative for adenopathy. Does not bruise/bleed easily.  Psychiatric/Behavioral: Negative for behavioral problems and confusion.  All other systems reviewed and are negative.   Blood pressure (!) 138/91, pulse 81, temperature 97.9 F (36.6 C), temperature source Oral, resp. rate 18, SpO2 99 %. Physical Exam  Vitals reviewed. Constitutional: He is oriented to person, place, and time. He appears well-developed and well-nourished. No distress.  HENT:  Head: Normocephalic and atraumatic. Head is without raccoon's eyes, without abrasion and without contusion.  Right Ear: Hearing and external ear normal.  Left Ear: Hearing and external ear normal.  Nose: No nose lacerations or nasal deformity.  Mouth/Throat: Oropharynx is clear and moist. Mucous membranes are not pale. He does not have dentures.  Eyes: Pupils are equal, round, and reactive to light. Conjunctivae are normal. No scleral icterus.  Neck: No tracheal deviation present. No thyromegaly present.  Cardiovascular: Normal rate, regular rhythm, normal heart sounds and normal pulses.  Respiratory: Effort normal and breath sounds normal. No stridor. No respiratory distress. He has no wheezes.  GI: Soft. Normal appearance. He exhibits no distension. There is no abdominal tenderness. There is no rigidity, no rebound and no guarding. No hernia.  Genitourinary:    Genitourinary Comments: Mildly engorged external hemorrhoidal tissue; DRE - reveals palpable  silicone foreign body probably about 5-6 cm from anal verge   Musculoskeletal:        General: No tenderness or edema.     Cervical back: Full passive range of motion without pain, normal range of motion and neck supple. No edema.  Lymphadenopathy:    He has no cervical adenopathy.  Neurological: He is alert and oriented to person, place, and time. He exhibits normal muscle tone.  Skin: Skin is warm and dry. No rash noted. He is not diaphoretic. No erythema. No pallor.  Psychiatric: He has a normal mood and affect. His behavior is normal. Judgment and thought content normal.     Assessment/Plan Rectal foreign body  I personally reviewed his plain film, labs and CT imaging No signs of  perforation.  No fever or leukocytosis. Object is palpable on rectal exam but not able to be extracted with digital rectal exam in the ER  Therefore have recommended proceeding going to the operating room for exam under anesthesia, removal of foreign body and possible rigid proctoscopy  Discussed risk and benefits of the procedure including but not limited to bleeding, infection, injury to surrounding structures, perforation, potential need for exploratory laparotomy which I think is highly unlikely.  Discussed the typical postoperative course.  Discussed discharge instructions.  Discussed post procedure diet instructions.  We will probably be able to discharge the patient from the recovery room.  He states that he has a ride available to pick him up.  Mary Sella. Andrey Campanile, MD, FACS General, Bariatric, & Minimally Invasive Surgery [facility], [place]   Gaynelle Adu, MD 10/15/2019, 9:56 PM

## 2021-07-14 IMAGING — DX DG ABDOMEN 2V
3 series · 3 of 3 positions shown · non-contrast
Comparison: No prior.

CLINICAL DATA: History of rectal foreign body.

EXAM:
ABDOMEN - 2 VIEW

[abdomen erect]
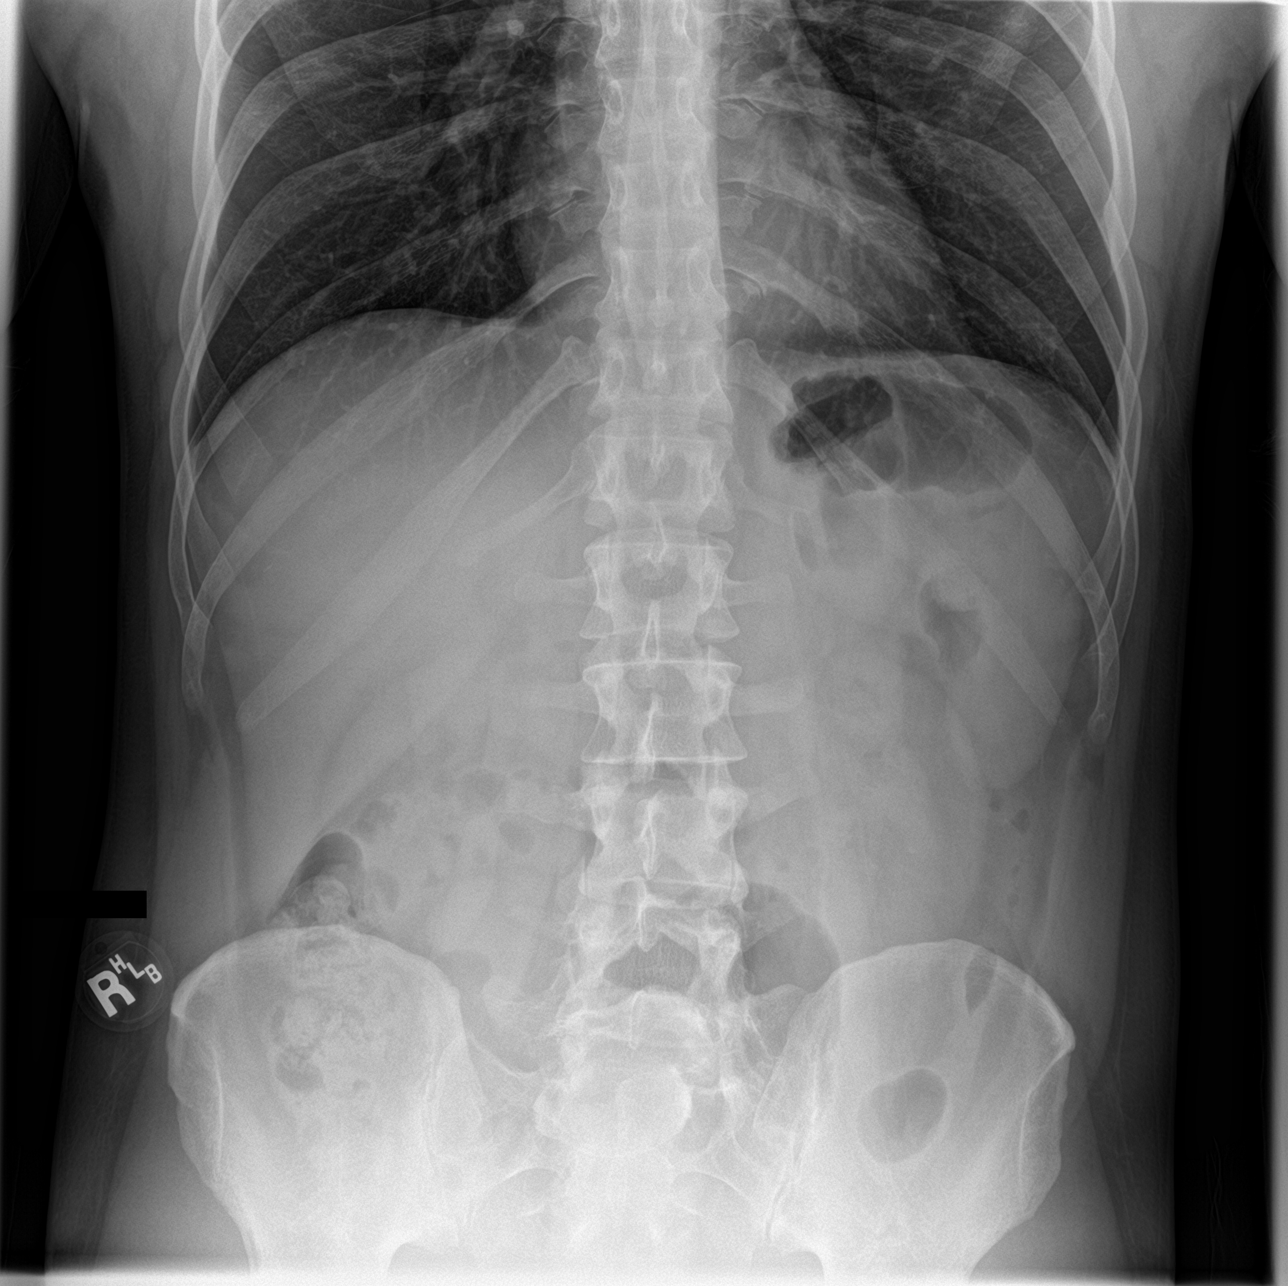

[abdomen supine (1 of 2)]
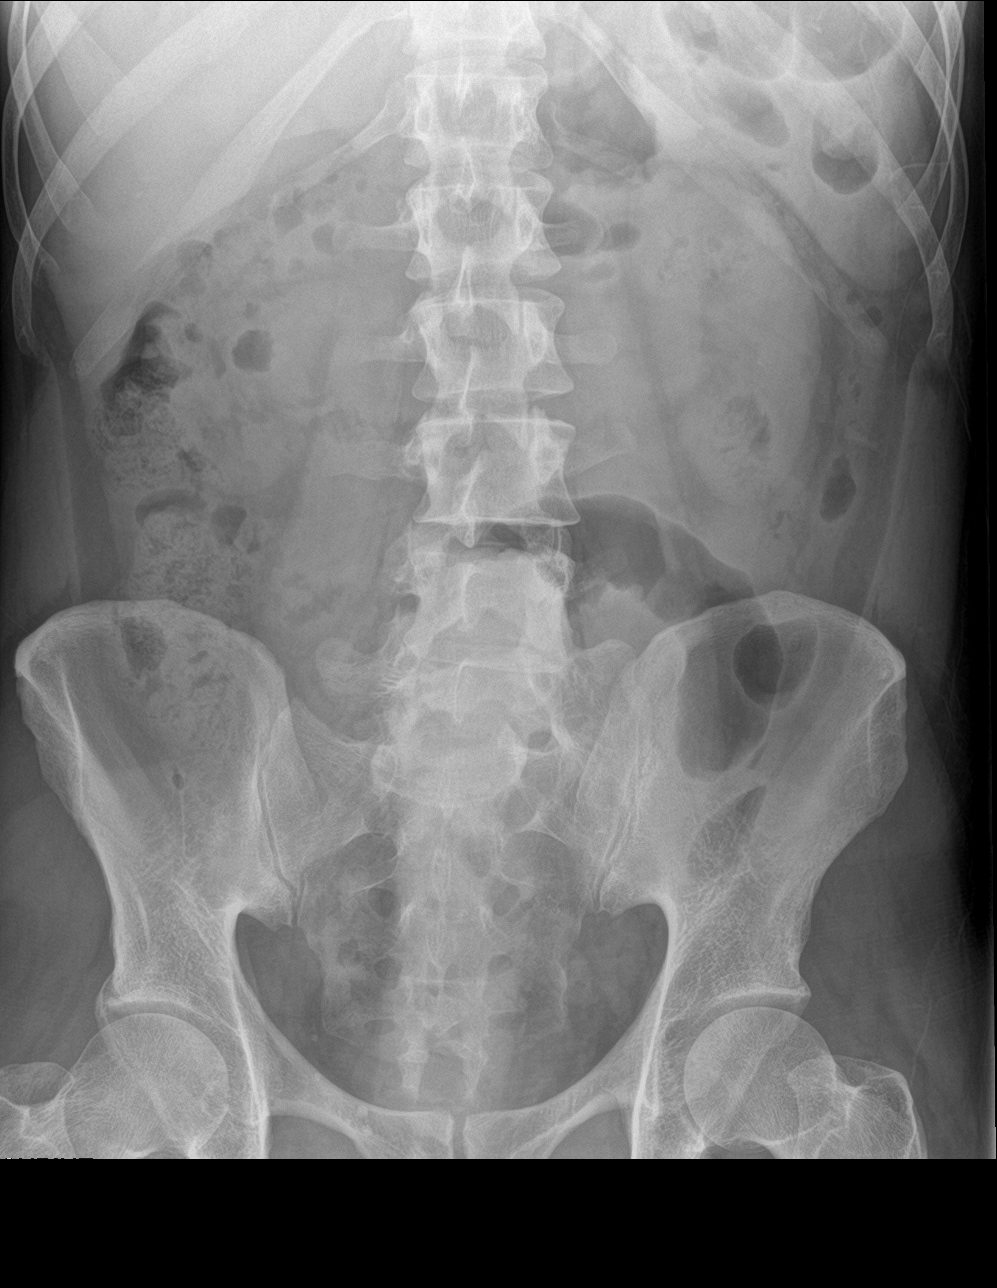

[abdomen supine (2 of 2)]
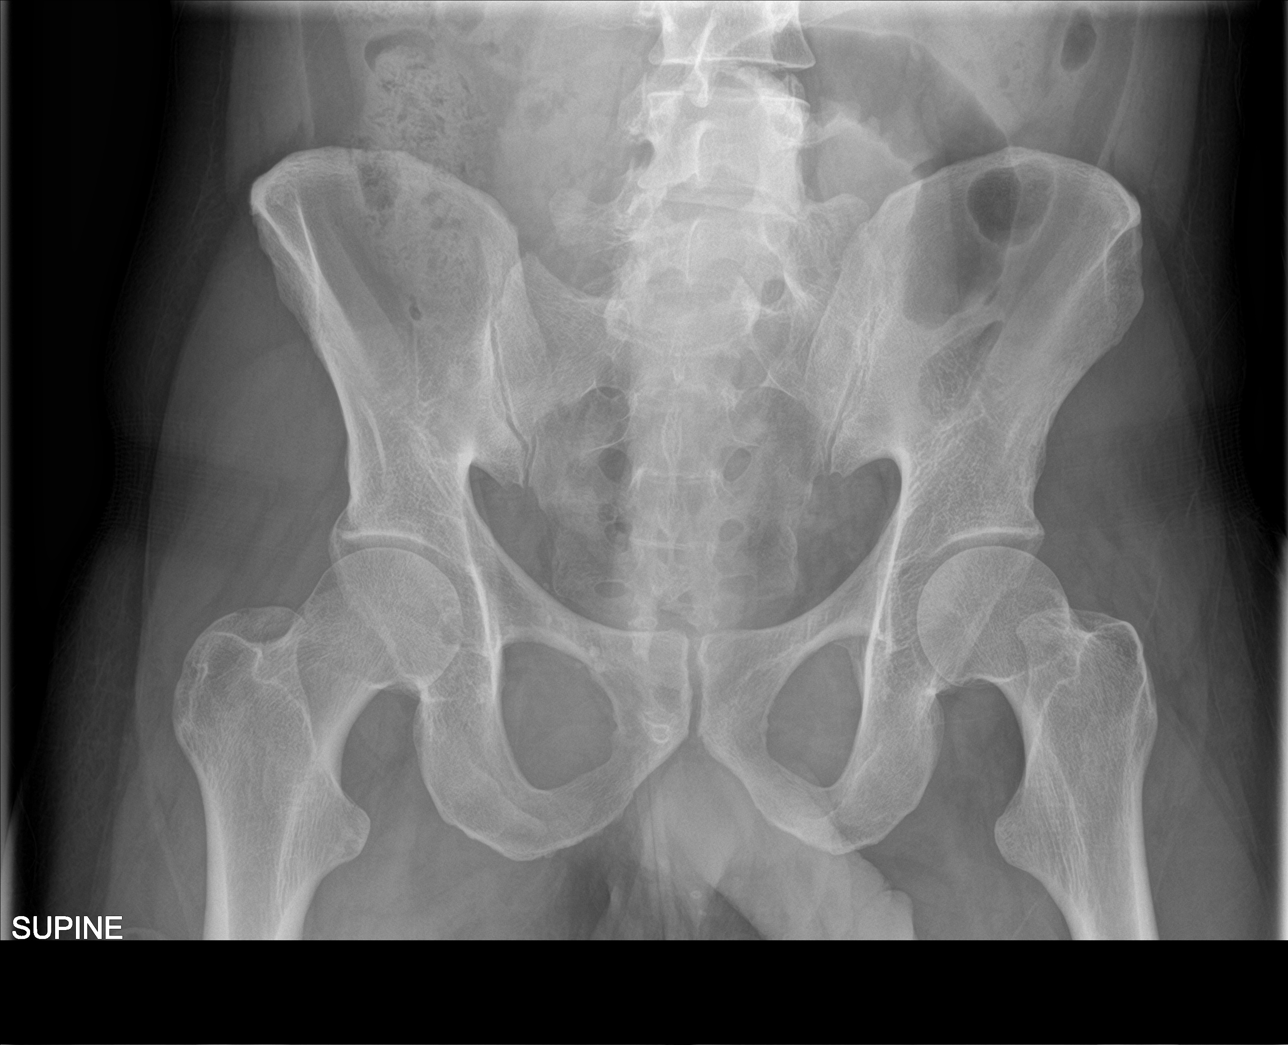

[3 of 3 positions shown; findings below may reference images not displayed]

FINDINGS: No bowel distention. Stool noted throughout the colon. No radiopaque
foreign body noted. Pelvic calcifications consistent with
phleboliths. No acute bony abnormality identified. Lung bases are
clear.
IMPRESSION: No radiopaque foreign body.  No bowel distention or free air.

## 2021-07-14 IMAGING — CT CT ABD-PELV W/O CM
2 of 4 series · 16 of 46 positions shown, 18 images · non-contrast
Comparison: 10/15/2019 radiograph

CLINICAL DATA: Foreign body in rectum

EXAM:
CT ABDOMEN AND PELVIS WITHOUT CONTRAST
TECHNIQUE: Multidetector CT imaging of the abdomen and pelvis was performed
following the standard protocol without IV contrast.

[Series 3: ap without · axial · non-contrast · 0.83mm/px · z∈[-762,-292]mm · 13 of 106 slices shown, 15 images]
[im 6/106  soft-tissue]
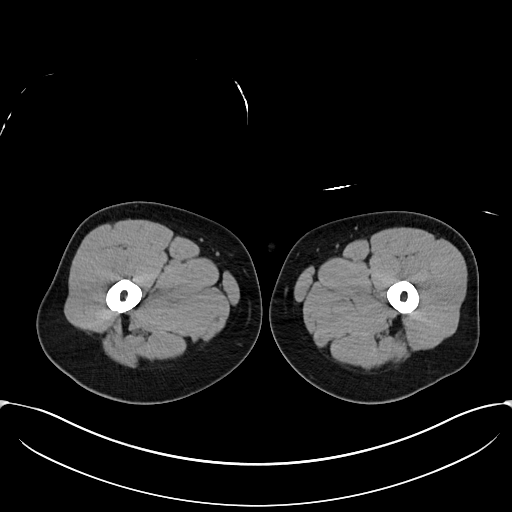
[im 6/106  bone]
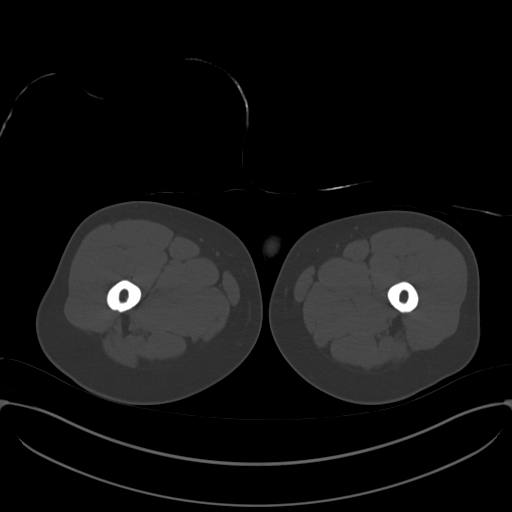
[im 16/106  soft-tissue]
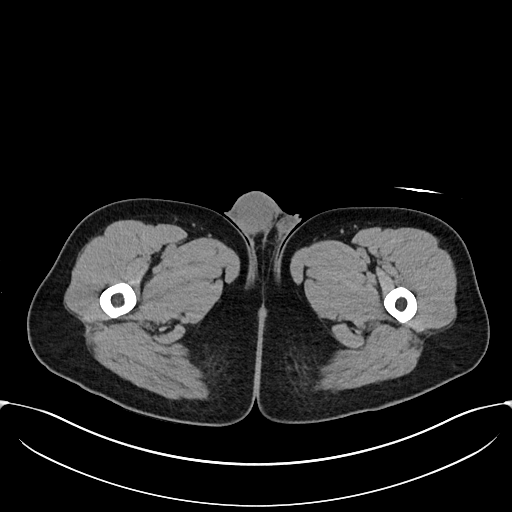
[im 22/106  soft-tissue]
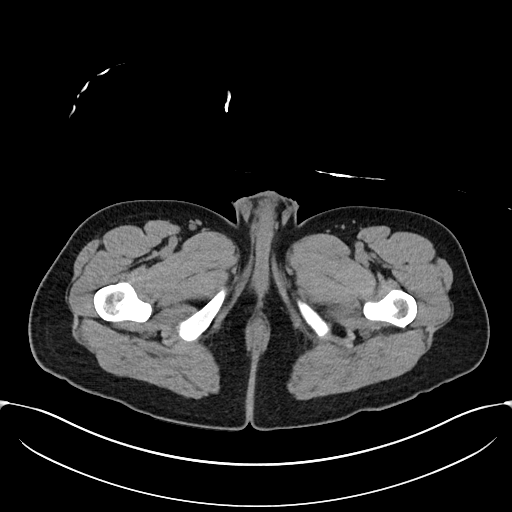
[im 32/106  soft-tissue]
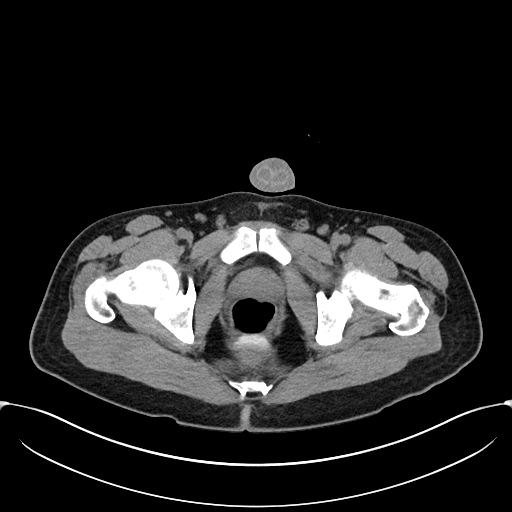
[im 37/106  soft-tissue]
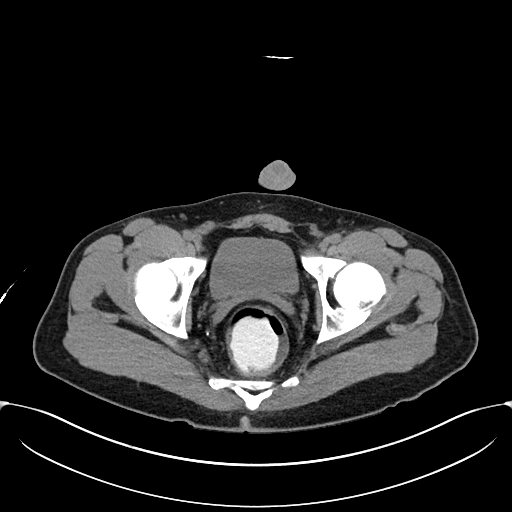
[im 48/106  soft-tissue]
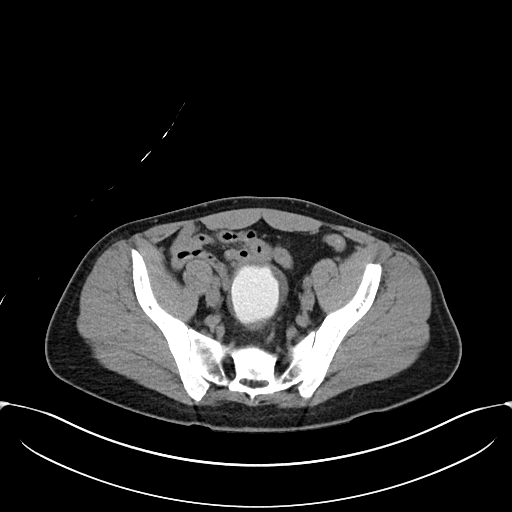
[im 53/106  soft-tissue]
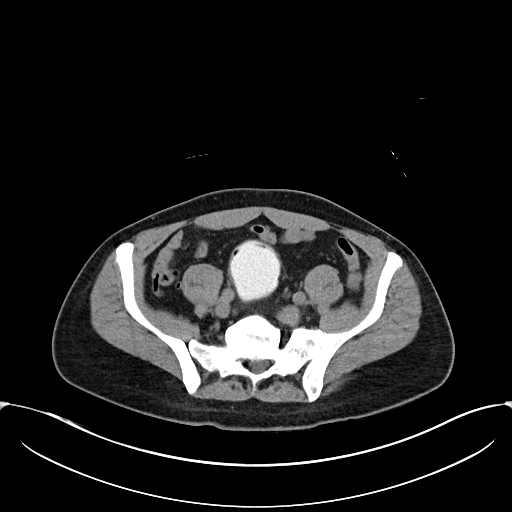
[im 58/106  soft-tissue]
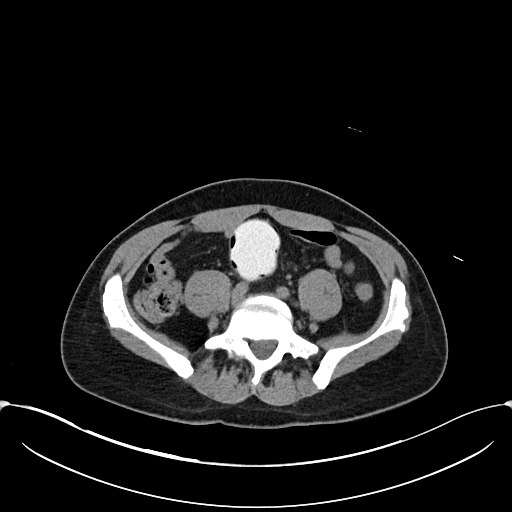
[im 69/106  soft-tissue]
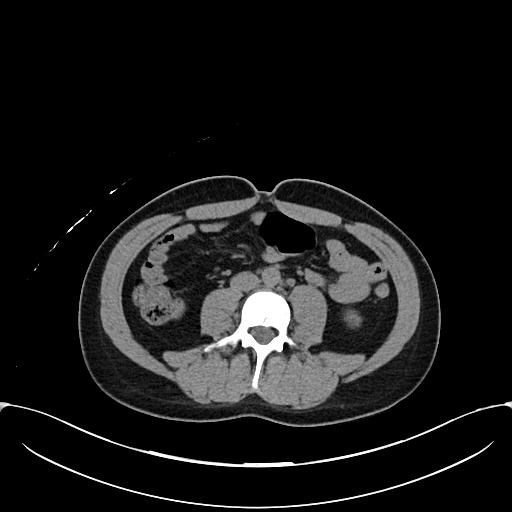
[im 69/106  bone]
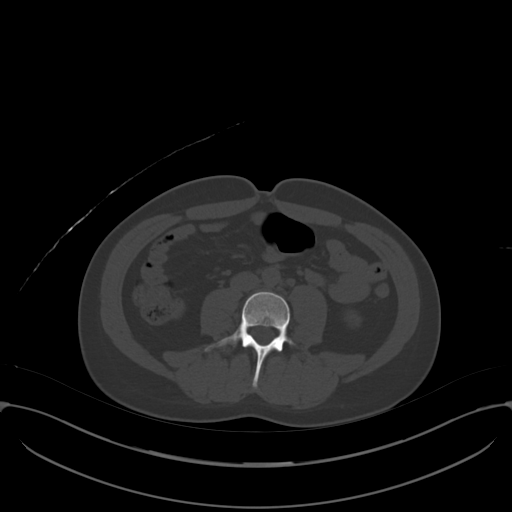
[im 74/106  soft-tissue]
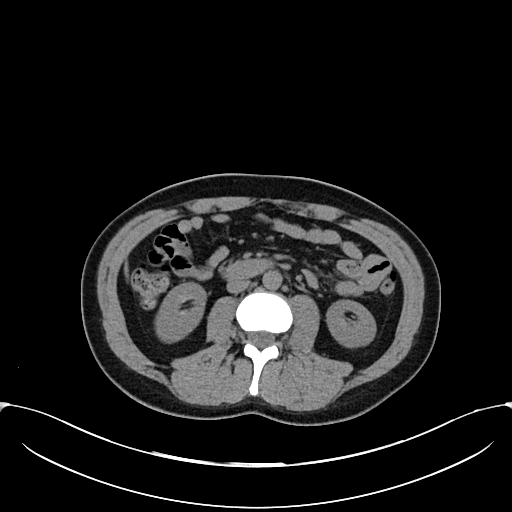
[im 85/106  soft-tissue]
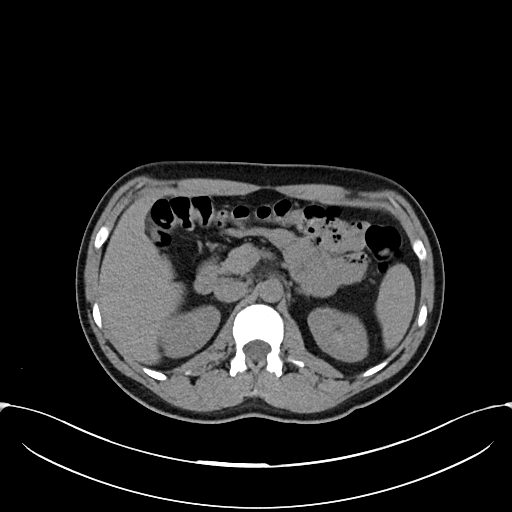
[im 90/106  soft-tissue]
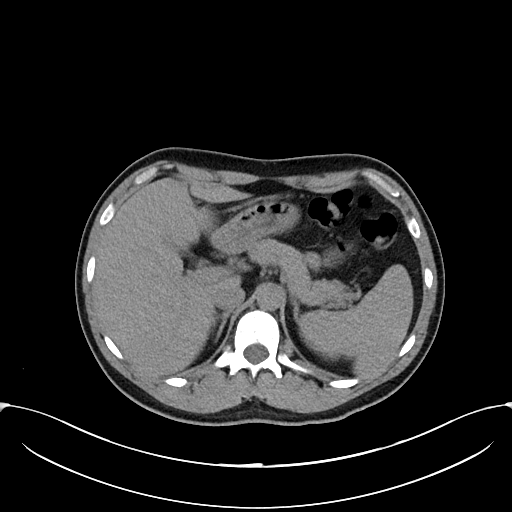
[im 100/106  soft-tissue]
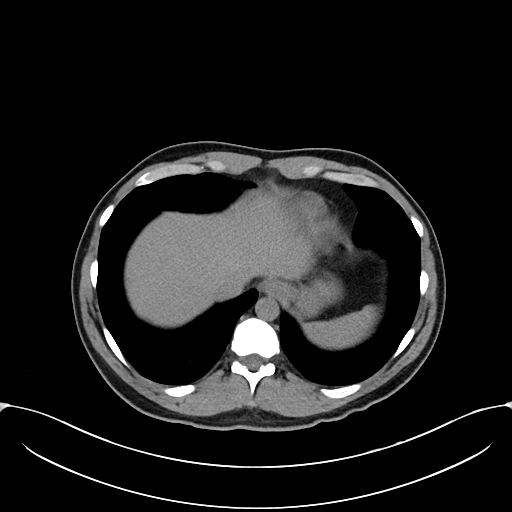

[Series 6: cor · coronal · 0.79mm/px · 3 of 82 slices shown]
[im 28/82  soft-tissue]
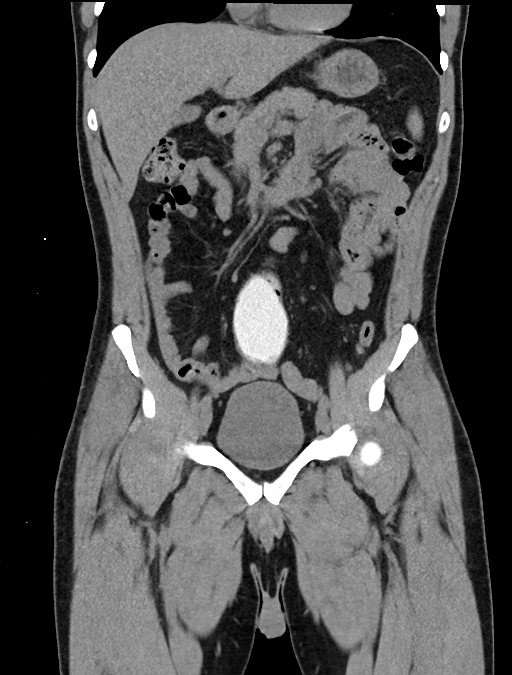
[im 37/82  soft-tissue]
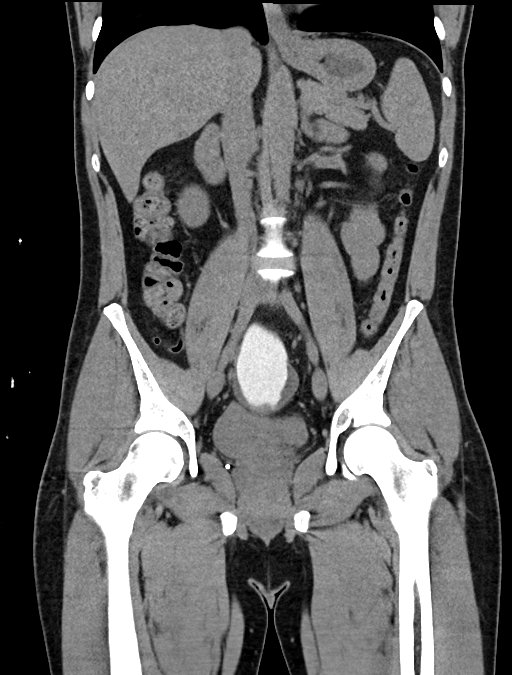
[im 46/82  soft-tissue]
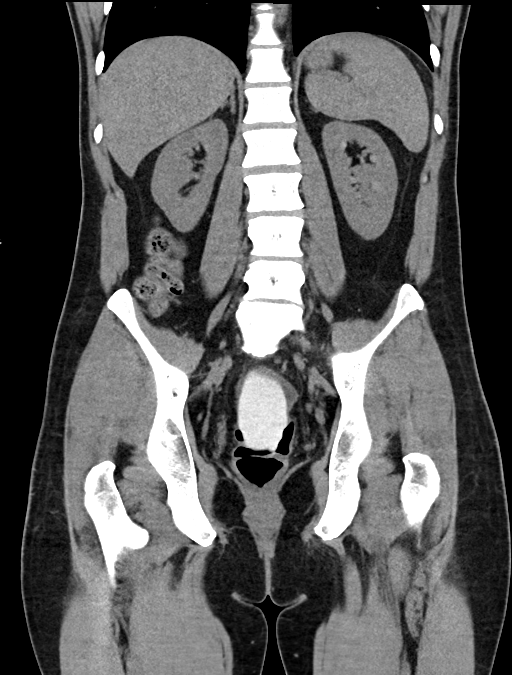

[16 of 46 positions shown; findings below may reference images not displayed]

FINDINGS: Lower chest: Lung bases are clear.  Heart size is normal

Hepatobiliary: No focal liver abnormality is seen. No gallstones,
gallbladder wall thickening, or biliary dilatation.

Pancreas: Unremarkable. No pancreatic ductal dilatation or
surrounding inflammatory changes.

Spleen: Normal in size without focal abnormality.

Adrenals/Urinary Tract: Adrenal glands are normal. No
hydronephrosis. Punctate stone lower pole left kidney. The bladder
is unremarkable

Stomach/Bowel: The stomach is normal. No dilated small bowel.
Negative appendix. Large foreign body within the rectum and sigmoid
colon, dome-shaped at the apex and measures about 19.3 cm maximum
length and about 4 cm in diameter. No extraluminal gas to suggest a
perforation.

Vascular/Lymphatic: No significant vascular findings are present. No
enlarged abdominal or pelvic lymph nodes.

Reproductive: Prostate is unremarkable.

Other: Negative for free air or free fluid

Musculoskeletal: No acute or significant osseous findings.
IMPRESSION: 1. 19.3 cm long by 4 cm wide foreign body within the rectum and
sigmoid colon. No extraluminal gas to suggest perforation.
2. Nonobstructing stone in the left kidney
